# Patient Record
Sex: Male | Born: 1960 | Race: White | Hispanic: No | Marital: Married | Smoking: Current every day smoker
Health system: Southern US, Community
[De-identification: ages and names within clinical notes are randomized; demographics above are authoritative.]

---

## 2018-09-27 ENCOUNTER — Emergency Department (HOSPITAL_BASED_OUTPATIENT_CLINIC_OR_DEPARTMENT_OTHER)
Admission: EM | Admit: 2018-09-27 | Discharge: 2018-09-27 | Disposition: A | Discharge status: Home / Self Care | Payer: BC Managed Care – PPO | Attending: Emergency Medicine | Admitting: Emergency Medicine

## 2018-09-27 ENCOUNTER — Encounter (HOSPITAL_BASED_OUTPATIENT_CLINIC_OR_DEPARTMENT_OTHER): Payer: Self-pay

## 2018-09-27 ENCOUNTER — Other Ambulatory Visit: Payer: Self-pay

## 2018-09-27 ENCOUNTER — Emergency Department (HOSPITAL_BASED_OUTPATIENT_CLINIC_OR_DEPARTMENT_OTHER): Payer: BC Managed Care – PPO

## 2018-09-27 DIAGNOSIS — F17210 Nicotine dependence, cigarettes, uncomplicated: Secondary | ICD-10-CM | POA: Diagnosis not present

## 2018-09-27 DIAGNOSIS — R599 Enlarged lymph nodes, unspecified: Secondary | ICD-10-CM

## 2018-09-27 DIAGNOSIS — Z20828 Contact with and (suspected) exposure to other viral communicable diseases: Secondary | ICD-10-CM | POA: Insufficient documentation

## 2018-09-27 DIAGNOSIS — R509 Fever, unspecified: Secondary | ICD-10-CM | POA: Insufficient documentation

## 2018-09-27 DIAGNOSIS — R1909 Other intra-abdominal and pelvic swelling, mass and lump: Secondary | ICD-10-CM | POA: Diagnosis present

## 2018-09-27 LAB — CBC WITH DIFFERENTIAL/PLATELET
Abs Immature Granulocytes: 0.03 10*3/uL (ref 0.00–0.07)
Basophils Absolute: 0 10*3/uL (ref 0.0–0.1)
Basophils Relative: 0 %
Eosinophils Absolute: 0.2 10*3/uL (ref 0.0–0.5)
Eosinophils Relative: 1 %
HCT: 41.5 % (ref 39.0–52.0)
Hemoglobin: 13.7 g/dL (ref 13.0–17.0)
Immature Granulocytes: 0 %
Lymphocytes Relative: 17 %
Lymphs Abs: 1.8 10*3/uL (ref 0.7–4.0)
MCH: 30.8 pg (ref 26.0–34.0)
MCHC: 33 g/dL (ref 30.0–36.0)
MCV: 93.3 fL (ref 80.0–100.0)
Monocytes Absolute: 0.7 10*3/uL (ref 0.1–1.0)
Monocytes Relative: 7 %
Neutro Abs: 7.9 10*3/uL — ABNORMAL HIGH (ref 1.7–7.7)
Neutrophils Relative %: 75 %
Platelets: 282 10*3/uL (ref 150–400)
RBC: 4.45 MIL/uL (ref 4.22–5.81)
RDW: 13.5 % (ref 11.5–15.5)
WBC: 10.6 10*3/uL — ABNORMAL HIGH (ref 4.0–10.5)
nRBC: 0 % (ref 0.0–0.2)

## 2018-09-27 LAB — URINALYSIS, MICROSCOPIC (REFLEX)

## 2018-09-27 LAB — URINALYSIS, ROUTINE W REFLEX MICROSCOPIC
Bilirubin Urine: NEGATIVE
Glucose, UA: NEGATIVE mg/dL
Ketones, ur: 15 mg/dL — AB
Leukocytes,Ua: NEGATIVE
Nitrite: NEGATIVE
Protein, ur: NEGATIVE mg/dL
Specific Gravity, Urine: 1.01 (ref 1.005–1.030)
pH: 5.5 (ref 5.0–8.0)

## 2018-09-27 LAB — COMPREHENSIVE METABOLIC PANEL
ALT: 15 U/L (ref 0–44)
AST: 16 U/L (ref 15–41)
Albumin: 3.6 g/dL (ref 3.5–5.0)
Alkaline Phosphatase: 98 U/L (ref 38–126)
Anion gap: 10 (ref 5–15)
BUN: 18 mg/dL (ref 6–20)
CO2: 22 mmol/L (ref 22–32)
Calcium: 8.4 mg/dL — ABNORMAL LOW (ref 8.9–10.3)
Chloride: 105 mmol/L (ref 98–111)
Creatinine, Ser: 1.06 mg/dL (ref 0.61–1.24)
GFR calc Af Amer: >60 mL/min (ref >60)
GFR calc non Af Amer: >60 mL/min (ref >60)
Glucose, Bld: 109 mg/dL — ABNORMAL HIGH (ref 70–99)
Potassium: 3.5 mmol/L (ref 3.5–5.1)
Sodium: 137 mmol/L (ref 135–145)
Total Bilirubin: 0.7 mg/dL (ref 0.3–1.2)
Total Protein: 7 g/dL (ref 6.5–8.1)

## 2018-09-27 LAB — SARS CORONAVIRUS 2 AG (30 MIN TAT): SARS Coronavirus 2 Ag: NEGATIVE

## 2018-09-27 LAB — LACTIC ACID, PLASMA: Lactic Acid, Venous: 1 mmol/L (ref 0.5–1.9)

## 2018-09-27 MED ORDER — ACETAMINOPHEN 500 MG PO TABS
1000.0000 mg | ORAL_TABLET | Freq: Once | ORAL | Status: AC
  Administered 2018-09-27: 1000 mg via ORAL
  Filled 2018-09-27: qty 2

## 2018-09-27 MED ORDER — CEPHALEXIN 500 MG PO CAPS: 500.0000 mg | ORAL_CAPSULE | Freq: Two times a day (BID) | ORAL | 0 refills | Status: AC

## 2018-09-27 MED ORDER — FENTANYL CITRATE (PF) 100 MCG/2ML IJ SOLN
50.0000 ug | Freq: Once | INTRAMUSCULAR | Status: DC
  Filled 2018-09-27: qty 2

## 2018-09-27 MED ORDER — IOHEXOL 300 MG/ML  SOLN
100.0000 mL | Freq: Once | INTRAMUSCULAR | Status: AC | PRN
  Administered 2018-09-27: 23:00:00 100 mL via INTRAVENOUS

## 2018-09-27 NOTE — ED Provider Notes (Signed)
[facility] EMERGENCY DEPARTMENT Provider Note   CSN: [id number] Arrival date & time: 09/27/18  2040    History   Chief Complaint Chief Complaint  Patient presents with   Groin Swelling    HPI Trevor Sharp is a 58 y.o. male.     The history is provided by the patient.  Leg Pain Location:  Leg Time since incident:  2 days Injury: no   Leg location:  L upper leg Pain details:    Quality:  Aching   Radiates to:  Does not radiate   Severity:  Mild   Onset quality:  Gradual   Duration:  2 days   Timing:  Intermittent   Progression:  Waxing and waning Chronicity:  New Relieved by:  Nothing Worsened by:  Activity Associated symptoms: swelling   Associated symptoms: no back pain, no decreased ROM, no fatigue, no fever, no itching, no muscle weakness, no neck pain, no numbness and no stiffness     History reviewed. No pertinent past medical history.  There are no active problems to display for this patient.   History reviewed. No pertinent surgical history.      Home Medications    Prior to Admission medications   Medication Sig Start Date End Date Taking? Authorizing Provider  cephALEXin (KEFLEX) 500 MG capsule Take 1 capsule (500 mg total) by mouth 2 (two) times daily for 7 days. 09/27/18 10/04/18  Virgina Norfolkuratolo, Marlyn Rabine, DO    Family History No family history on file.  Social History Social History   Tobacco Use   Smoking status: Current Every Day Smoker    Types: Cigarettes   Smokeless tobacco: Never Used  Substance Use Topics   Alcohol use: Never    Frequency: Never   Drug use: Never     Allergies   Patient has no known allergies.   Review of Systems Review of Systems  Constitutional: Negative for chills, fatigue and fever.  HENT: Negative for ear pain and sore throat.   Eyes: Negative for pain and visual disturbance.  Respiratory: Negative for cough and shortness of breath.   Cardiovascular: Negative for chest pain and  palpitations.  Gastrointestinal: Negative for abdominal pain and vomiting.  Genitourinary: Negative for decreased urine volume, difficulty urinating, dysuria, enuresis, flank pain, frequency, genital sores, hematuria, penile pain, penile swelling, scrotal swelling, testicular pain and urgency.  Musculoskeletal: Negative for arthralgias, back pain, neck pain and stiffness.  Skin: Negative for color change, itching and rash.  Neurological: Negative for seizures and syncope.  All other systems reviewed and are negative.    Physical Exam Updated Vital Signs  ED Triage Vitals  Enc Vitals Group     BP 09/27/18 2057 (!) 171/100     Pulse Rate 09/27/18 2057 (!) 112     Resp 09/27/18 2057 20     Temp 09/27/18 2057 98.3 F (36.8 C)     Temp Source 09/27/18 2057 Oral     SpO2 09/27/18 2057 93 %     Weight 09/27/18 2057 152 lb (68.9 kg)     Height 09/27/18 2057 5\' 9"  (1.753 m)     Head Circumference --      Peak Flow --      Pain Score 09/27/18 2055 0     Pain Loc --      Pain Edu? --      Excl. in GC? --     Physical Exam Vitals signs and nursing note reviewed.  Constitutional:  General: He is not in acute distress.    Appearance: He is well-developed. He is not ill-appearing.  HENT:     Head: Normocephalic and atraumatic.     Nose: Nose normal.     Mouth/Throat:     Mouth: Mucous membranes are moist.     Pharynx: No posterior oropharyngeal erythema.  Eyes:     Extraocular Movements: Extraocular movements intact.     Conjunctiva/sclera: Conjunctivae normal.     Pupils: Pupils are equal, round, and reactive to light.  Neck:     Musculoskeletal: Normal range of motion and neck supple.  Cardiovascular:     Rate and Rhythm: Regular rhythm. Tachycardia present.     Heart sounds: Normal heart sounds. No murmur.  Pulmonary:     Effort: Pulmonary effort is normal. No respiratory distress.     Breath sounds: Normal breath sounds.  Abdominal:     General: There is no  distension.     Palpations: Abdomen is soft. There is no mass.     Tenderness: There is no abdominal tenderness.     Hernia: No hernia is present.  Genitourinary:    Penis: Normal.      Scrotum/Testes: Normal.  Musculoskeletal: Normal range of motion.        General: Tenderness present.     Comments: Tenderness over anterior thigh with some firmness, not reducible  Skin:    General: Skin is warm and dry.     Capillary Refill: Capillary refill takes less than 2 seconds.     Findings: Erythema (mild over anterior left thigh) present.  Neurological:     General: No focal deficit present.     Mental Status: He is alert.  Psychiatric:        Mood and Affect: Mood normal.      ED Treatments / Results  Labs (all labs ordered are listed, but only abnormal results are displayed) Labs Reviewed  CBC WITH DIFFERENTIAL/PLATELET - Abnormal; Notable for the following components:      Result Value   WBC 10.6 (*)    Neutro Abs 7.9 (*)    All other components within normal limits  COMPREHENSIVE METABOLIC PANEL - Abnormal; Notable for the following components:   Glucose, Bld 109 (*)    Calcium 8.4 (*)    All other components within normal limits  URINALYSIS, ROUTINE W REFLEX MICROSCOPIC - Abnormal; Notable for the following components:   Hgb urine dipstick MODERATE (*)    Ketones, ur 15 (*)    All other components within normal limits  URINALYSIS, MICROSCOPIC (REFLEX) - Abnormal; Notable for the following components:   Bacteria, UA FEW (*)    All other components within normal limits  SARS CORONAVIRUS 2 (HOSP ORDER, PERFORMED IN [place] [facility] VIA ABBOTT ID)  CULTURE, BLOOD (ROUTINE X 2)  CULTURE, BLOOD (ROUTINE X 2)  URINE CULTURE  LACTIC ACID, PLASMA  LACTIC ACID, PLASMA    EKG None  Radiology Ct Pelvis W Contrast  Result Date: 09/27/2018 CLINICAL DATA:  Enlarged inguinal lymph nodes. EXAM: CT PELVIS WITH CONTRAST TECHNIQUE: Multidetector CT imaging of the pelvis was  performed using the standard protocol following the bolus administration of intravenous contrast. CONTRAST:  100mL OMNIPAQUE IOHEXOL 300 MG/ML  SOLN COMPARISON:  None. FINDINGS: Urinary Tract:  No abnormality visualized. Bowel:  Unremarkable visualized pelvic bowel loops. Vascular/Lymphatic: Atherosclerotic changes are noted of the abdominal aorta in the iliac vasculature. Reproductive: There is a possible left-sided hydrocele. The prostate gland is significantly  enlarged. Other: There are pathologically enlarged left inguinal lymph nodes measuring approximately 2.7 x 2.4 cm. No definite pathologically enlarged right inguinal lymph nodes. There are enlarged left pelvic sidewall lymph nodes measuring up to approximately 9 mm in width. There is enlarged left external iliac lymph node measuring approximately 1.2 cm. Musculoskeletal: There is a small fat containing left inguinal hernia. There is a bilateral pars defect at L5 resulting in grade 1-2 anterolisthesis of L5 on S1. There is severe disc height loss at the L4-L5 and L5-S1 levels. IMPRESSION: 1. Pathologically enlarged left inguinal lymph nodes measuring up to approximately 2.7 cm. There are enlarged left pelvic and left iliac chain lymph nodes as detailed above. These are amenable to percutaneous biopsy as clinically indicated. 2. Prostatomegaly. 3. 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 4. Possible left-sided hydrocele. 5.  Aortic Atherosclerosis (ICD10-I70.0). Electronically Signed   By: Constance Holster M.D.   On: 09/27/2018 23:12   Dg Chest Portable 1 View  Result Date: 09/27/2018 CLINICAL DATA:  Fever EXAM: PORTABLE CHEST 1 VIEW COMPARISON:  None. FINDINGS: No pleural effusion. Increased interstitial and ground-glass opacities at both bases. Normal heart size. No pneumothorax. IMPRESSION: Increased interstitial and hazy basilar opacity as may be seen with interstitial inflammatory process/possible viral  illness. Electronically Signed   By: Donavan Foil M.D.   On: 09/27/2018 23:01    Procedures Procedures (including critical care time)  Medications Ordered in ED Medications  fentaNYL (SUBLIMAZE) injection 50 mcg (50 mcg Intravenous Not Given 09/27/18 2136)  acetaminophen (TYLENOL) tablet 1,000 mg (1,000 mg Oral Given 09/27/18 2125)  iohexol (OMNIPAQUE) 300 MG/ML solution 100 mL (100 mLs Intravenous Contrast Given 09/27/18 2256)     Initial Impression / Assessment and Plan / ED Course  I have reviewed the triage vital signs and the nursing notes.  Pertinent labs & imaging results that were available during my care of the patient were reviewed by me and considered in my medical decision making (see chart for details).     Jymir Dunaj is a 58 year old male with no significant medical history who presents to the ED with left groin swelling and pain for the last 2 days.  Has had chills.  Has a fever and tachycardia upon arrival.  Concern for possible cellulitis.  Does not appear to be a hernia.  Patient has some swelling and redness to the left anterior thigh.  Does not feel like an abscess.  Denies any urinary symptoms.  No testicular pain on exam.  No concern for Fournier's gangrene or testicular infection.  Patient denies any concern for STD.  Patient denies any urinary symptoms, cough, sputum production.  Sepsis work-up was initiated.  Will however add a CT scan of the pelvis to evaluate for possible cellulitis in the left thigh.  Possibly deeper space infection.  Patient overall is well-appearing.  Urinalysis, blood cultures, lactic acid also collected.  Coronavirus test is negative.  Patient with mild leukocytosis at 10.6 but otherwise no significant anemia, electrolyte abnormality, kidney injury.  Lactic acid normal.  This x-ray shows some basilar opacities possibly some inflammation or viral process.  Patient does not have any cough or sputum production.  Negative for coronavirus.  Patient  is a smoker and possibly inflammatory.  Awaiting CT scan of pelvis and urinalysis.  Patient with CT scan of his pelvis that shows pathologic lymph nodes in the left groin.  Patient has pathologically enlarged left inguinal lymph nodes measuring up  to 2.7 cm.  He has also enlarged left pelvic and left iliac chain lymph nodes.  No other lymph nodes on the right side.  He does have an enlarged prostate.  Patient does not have any back pain.  No urinary symptoms.  Patient denies any IV drug use.  Shared decision was made with the patient to have him follow-up closely over the next few days with his primary care doctor in [place].  Patient does not live in the area and prefers to follow-up with his own doctors.  I have a very low concern that he has an occult bacteremia.  Suspect that he has some type of lymphoma or cancer process that is causing night sweats and fevers.  Recheck of temperature by myself was 99.6.  Patient with normal heart rate, normal blood pressure throughout my care.  As stated before lab work is not consistent with sepsis as well.  Patient did have blood cultures collected and will be called if they are positive.  He understands the need for follow-up and a biopsy of these lymph nodes to help diagnose likely cancer process.  Discharged in good condition.  Given return precautions. U/a wnl.   This chart was dictated using voice recognition software.  Despite best efforts to proofread,  errors can occur which can change the documentation meaning.    Final Clinical Impressions(s) / ED Diagnoses   Final diagnoses:  Fever, unspecified fever cause  Enlarged lymph nodes    ED Discharge Orders         Ordered    cephALEXin (KEFLEX) 500 MG capsule  2 times daily     09/27/18 2339           Virgina NorfolkCuratolo, Kerrigan Gombos, DO 09/27/18 2340

## 2018-09-27 NOTE — ED Triage Notes (Signed)
Pt c/o left groin swelling and "sweats" x 2 days-denies known fever/flu like sx-NAD-steady gait

## 2018-09-27 NOTE — Discharge Instructions (Addendum)
Follow-up with your primary care doctor to discuss getting biopsy.  Return to the ED if your symptoms worsen as discussed.  Take antibiotics as prescribed. Below are your CT scan findings.    CLINICAL DATA:  Enlarged inguinal lymph nodes.   EXAM:  CT PELVIS WITH CONTRAST   TECHNIQUE:  Multidetector CT imaging of the pelvis was performed using the  standard protocol following the bolus administration of intravenous  contrast.   CONTRAST:  114mL OMNIPAQUE IOHEXOL 300 MG/ML  SOLN   COMPARISON:  None.   FINDINGS:  Urinary Tract:  No abnormality visualized.   Bowel:  Unremarkable visualized pelvic bowel loops.   Vascular/Lymphatic: Atherosclerotic changes are noted of the  abdominal aorta in the iliac vasculature.   Reproductive: There is a possible left-sided hydrocele. The prostate  gland is significantly enlarged.   Other: There are pathologically enlarged left inguinal lymph nodes  measuring approximately 2.7 x 2.4 cm. No definite pathologically  enlarged right inguinal lymph nodes. There are enlarged left pelvic  sidewall lymph nodes measuring up to approximately 9 mm in width.  There is enlarged left external iliac lymph node measuring  approximately 1.2 cm.   Musculoskeletal: There is a small fat containing left inguinal  hernia. There is a bilateral pars defect at L5 resulting in grade  1-2 anterolisthesis of L5 on S1. There is severe disc height loss at  the L4-L5 and L5-S1 levels.   IMPRESSION:  1. Pathologically enlarged left inguinal lymph nodes measuring up to  approximately 2.7 cm. There are enlarged left pelvic and left iliac  chain lymph nodes as detailed above. These are amenable to  percutaneous biopsy as clinically indicated.  2. Prostatomegaly.  3. Bilateral pars defect at L5 resulting in grade 1-2  anterolisthesis of L5 on S1.  4. Possible left-sided hydrocele.  5.  Aortic Atherosclerosis (ICD10-I70.0).

## 2018-09-29 LAB — URINE CULTURE

## 2018-10-03 LAB — CULTURE, BLOOD (ROUTINE X 2)
Culture: NO GROWTH
Culture: NO GROWTH
Special Requests: ADEQUATE
Special Requests: ADEQUATE

## 2020-06-25 IMAGING — CT CT PELVIS WITH CONTRAST
2 of 3 series · 16 of 46 positions shown, 18 images · IV contrast (omnipaque)
Comparison: None.

CLINICAL DATA: Enlarged inguinal lymph nodes.

EXAM:
CT PELVIS WITH CONTRAST
TECHNIQUE: Multidetector CT imaging of the pelvis was performed using the
standard protocol following the bolus administration of intravenous
contrast.
CONTRAST:  100mL OMNIPAQUE IOHEXOL 300 MG/ML  SOLN

[Series 3: axial soft tissue · axial · 0.77mm/px · z∈[+313,+669]mm · 13 of 206 slices shown, 15 images]
[im 14/206  soft-tissue]
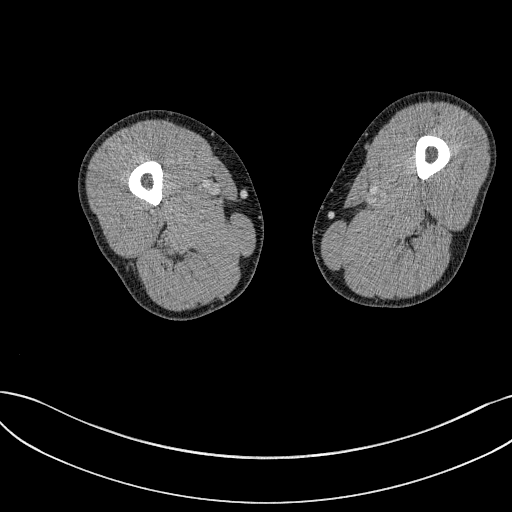
[im 14/206  bone]
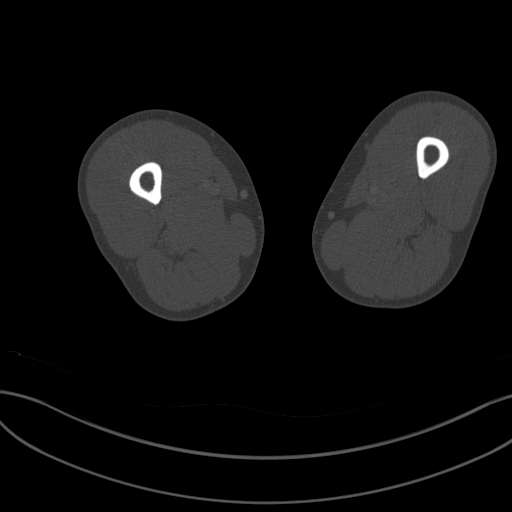
[im 27/206  soft-tissue]
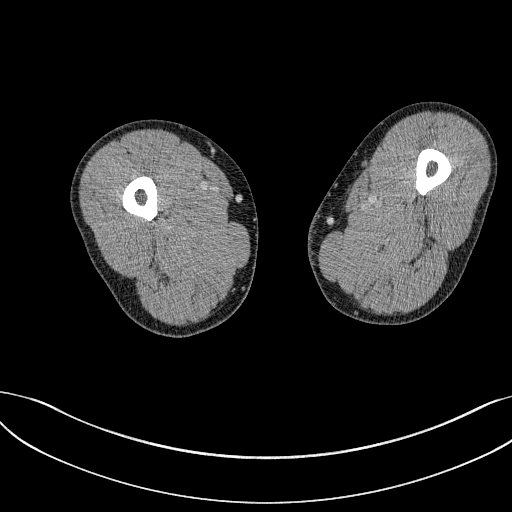
[im 40/206  soft-tissue]
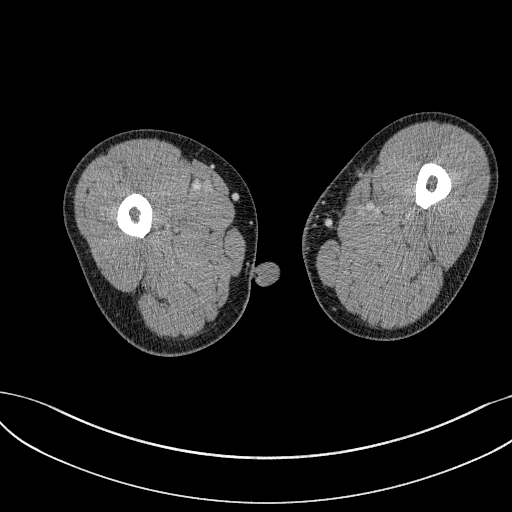
[im 60/206  soft-tissue]
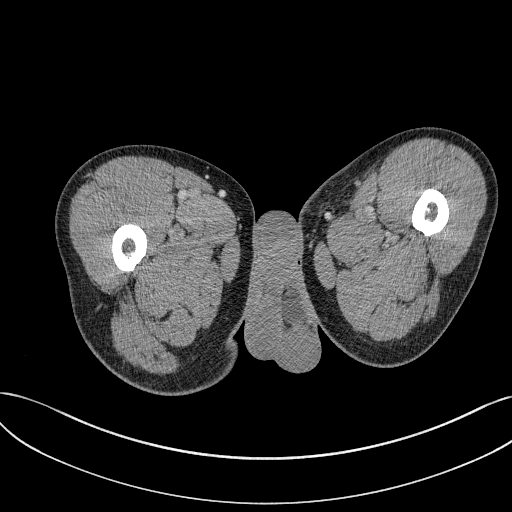
[im 73/206  soft-tissue]
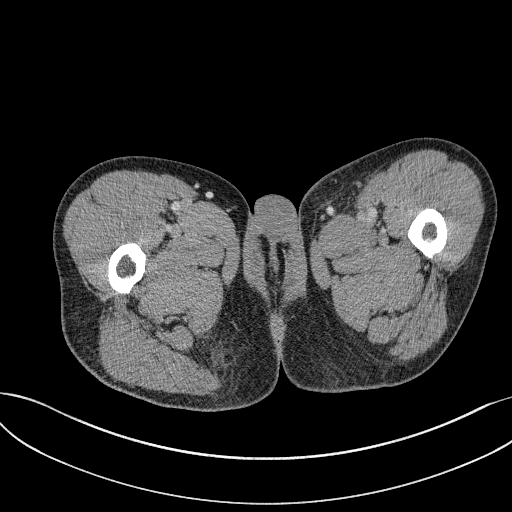
[im 86/206  soft-tissue]
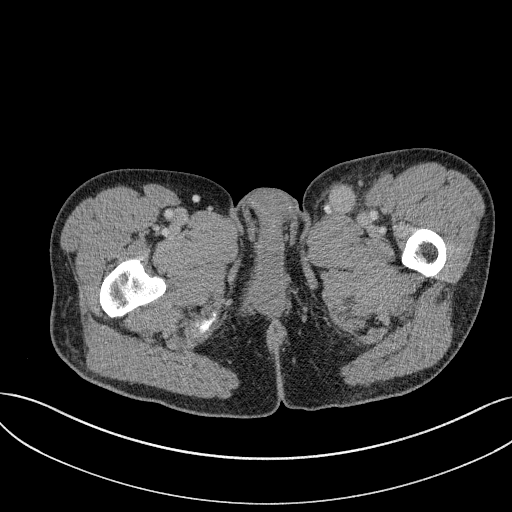
[im 106/206  soft-tissue]
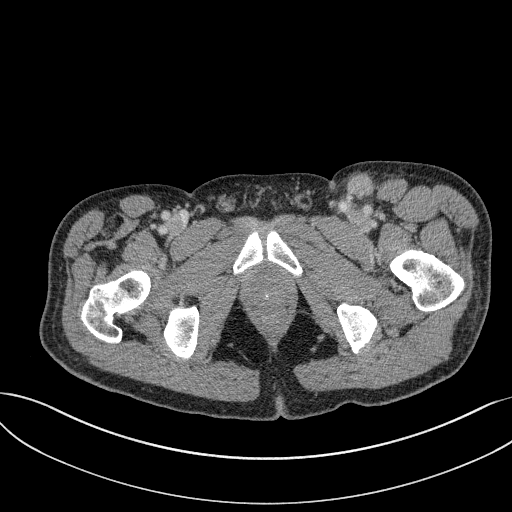
[im 120/206  soft-tissue]
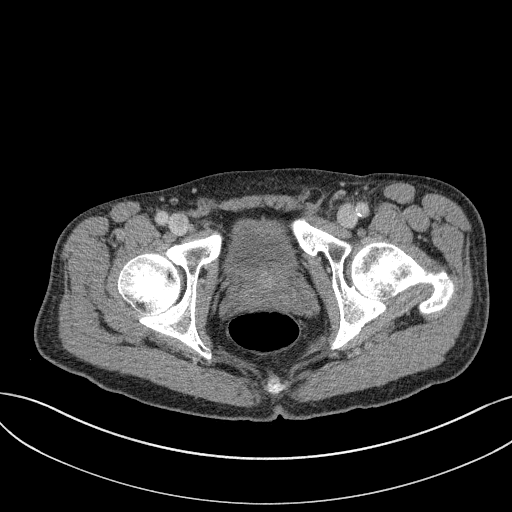
[im 133/206  soft-tissue]
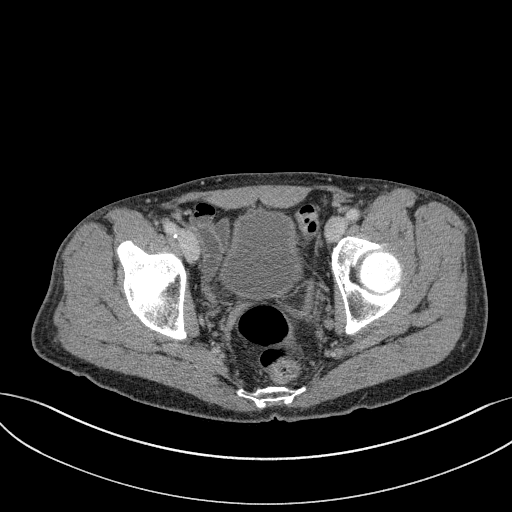
[im 133/206  bone]
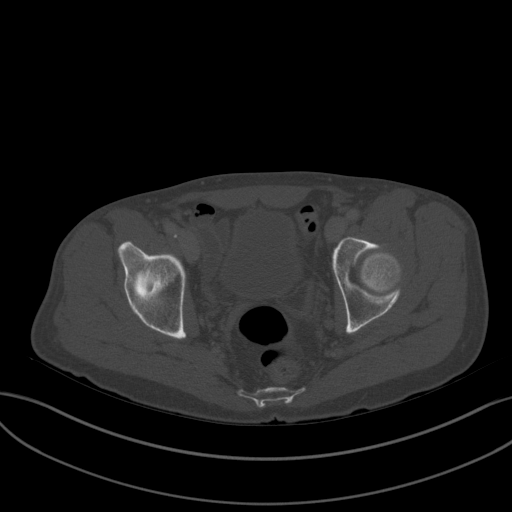
[im 146/206  soft-tissue]
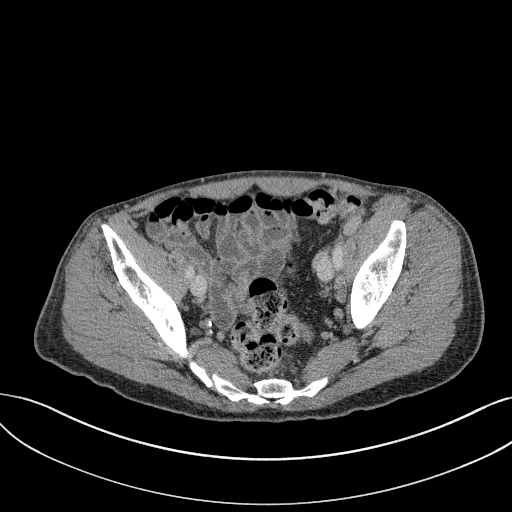
[im 166/206  soft-tissue]
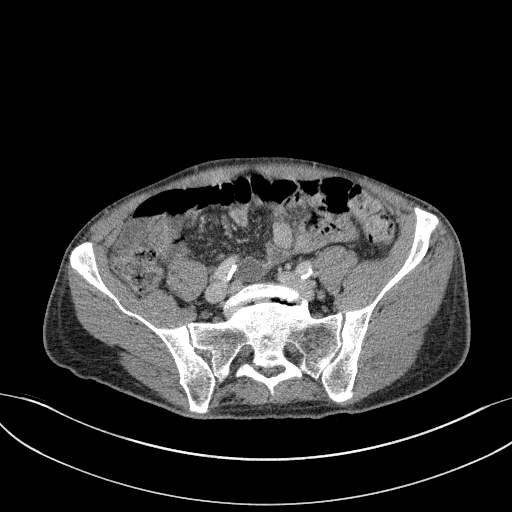
[im 179/206  soft-tissue]
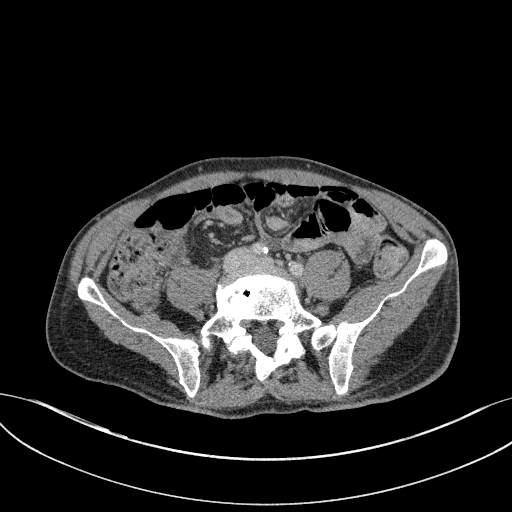
[im 192/206  soft-tissue]
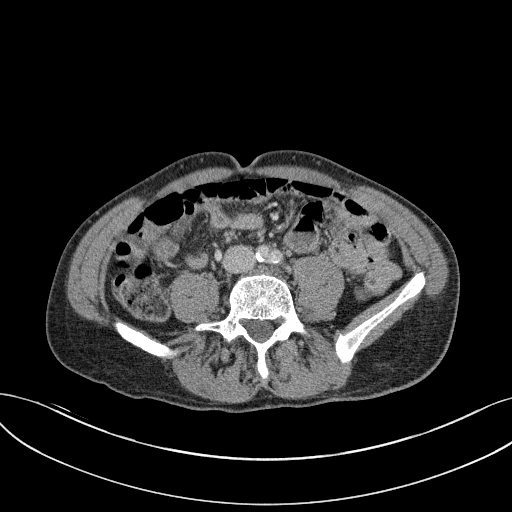

[Series 8: coronal st · coronal · 0.75mm/px · 3 of 131 slices shown]
[im 44/131  soft-tissue]
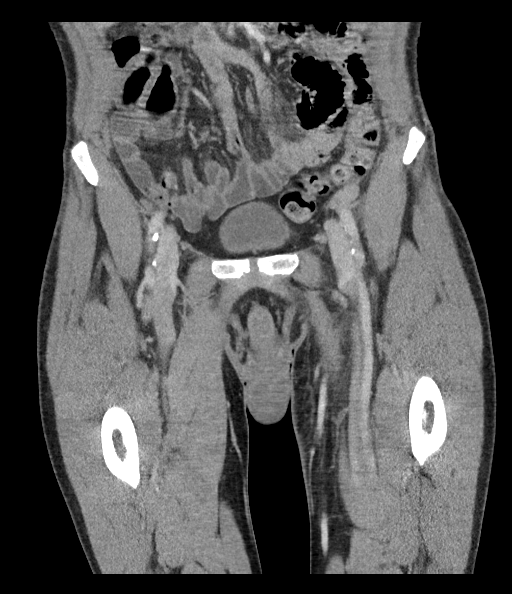
[im 58/131  soft-tissue]
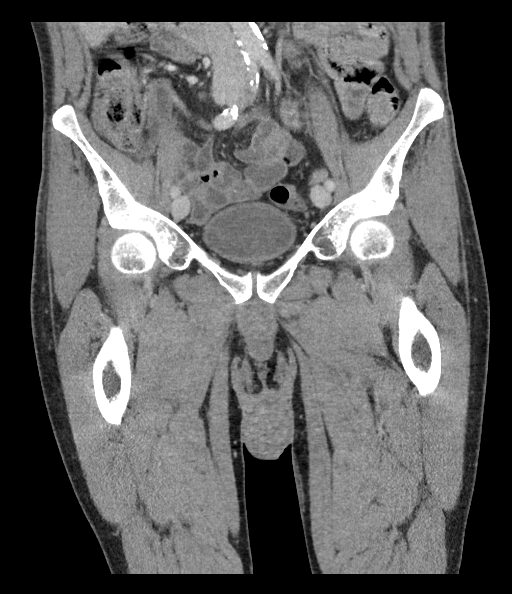
[im 73/131  soft-tissue]
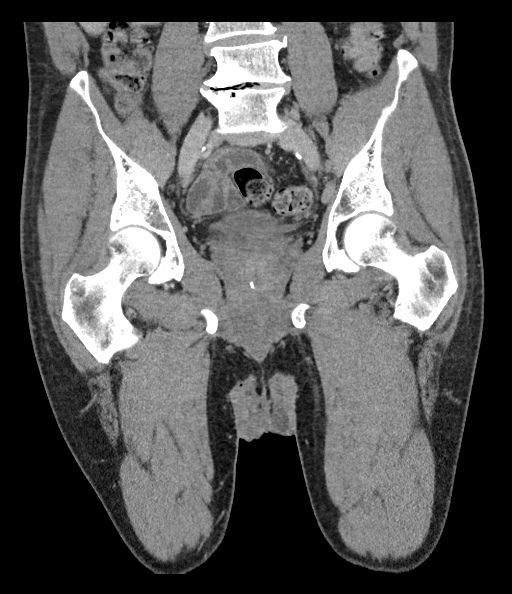

[16 of 46 positions shown; findings below may reference images not displayed]

FINDINGS: Urinary Tract:  No abnormality visualized.

Bowel:  Unremarkable visualized pelvic bowel loops.

Vascular/Lymphatic: Atherosclerotic changes are noted of the
abdominal aorta in the iliac vasculature.

Reproductive: There is a possible left-sided hydrocele. The prostate
gland is significantly enlarged.

Other: There are pathologically enlarged left inguinal lymph nodes
measuring approximately 2.7 x 2.4 cm. No definite pathologically
enlarged right inguinal lymph nodes. There are enlarged left pelvic
sidewall lymph nodes measuring up to approximately 9 mm in width.
There is enlarged left external iliac lymph node measuring
approximately 1.2 cm.

Musculoskeletal: There is a small fat containing left inguinal
hernia. There is a bilateral pars defect at L5 resulting in grade
1-2 anterolisthesis of L5 on S1. There is severe disc height loss at
the L4-L5 and L5-S1 levels.
IMPRESSION: 1. Pathologically enlarged left inguinal lymph nodes measuring up to
approximately 2.7 cm. There are enlarged left pelvic and left iliac
chain lymph nodes as detailed above. These are amenable to
percutaneous biopsy as clinically indicated.
2. Prostatomegaly.
3. Bilateral pars defect at L5 resulting in grade 1-2
anterolisthesis of L5 on S1.
4. Possible left-sided hydrocele.
5.  Aortic Atherosclerosis ([id number].X).
